# Patient Record
Sex: Female | Born: 1945 | Race: White | Hispanic: No | Marital: Married
Health system: Southern US, Community
[De-identification: ages and names within clinical notes are randomized; demographics above are authoritative.]

---

## 1999-12-01 ENCOUNTER — Encounter: Admission: RE | Admit: 1999-12-01 | Discharge: 1999-12-01 | Payer: Self-pay | Admitting: Nephrology

## 1999-12-01 ENCOUNTER — Encounter: Payer: Self-pay | Admitting: Nephrology

## 2003-07-15 ENCOUNTER — Encounter: Admission: RE | Admit: 2003-07-15 | Discharge: 2003-07-15 | Payer: Self-pay | Admitting: Nephrology

## 2003-07-15 ENCOUNTER — Encounter: Payer: Self-pay | Admitting: Nephrology

## 2008-04-12 ENCOUNTER — Encounter: Admission: RE | Admit: 2008-04-12 | Discharge: 2008-04-12 | Payer: Self-pay | Admitting: Nephrology

## 2008-04-12 IMAGING — CR DG HIP COMPLETE 2+V*R*
2 series · 2 of 2 positions shown · non-contrast
Comparison: None

CLINICAL DATA: Bilateral hip pain for 2 months, no trauma

RIGHT HIP - COMPLETE 2+ VIEW

[t hip ap right]
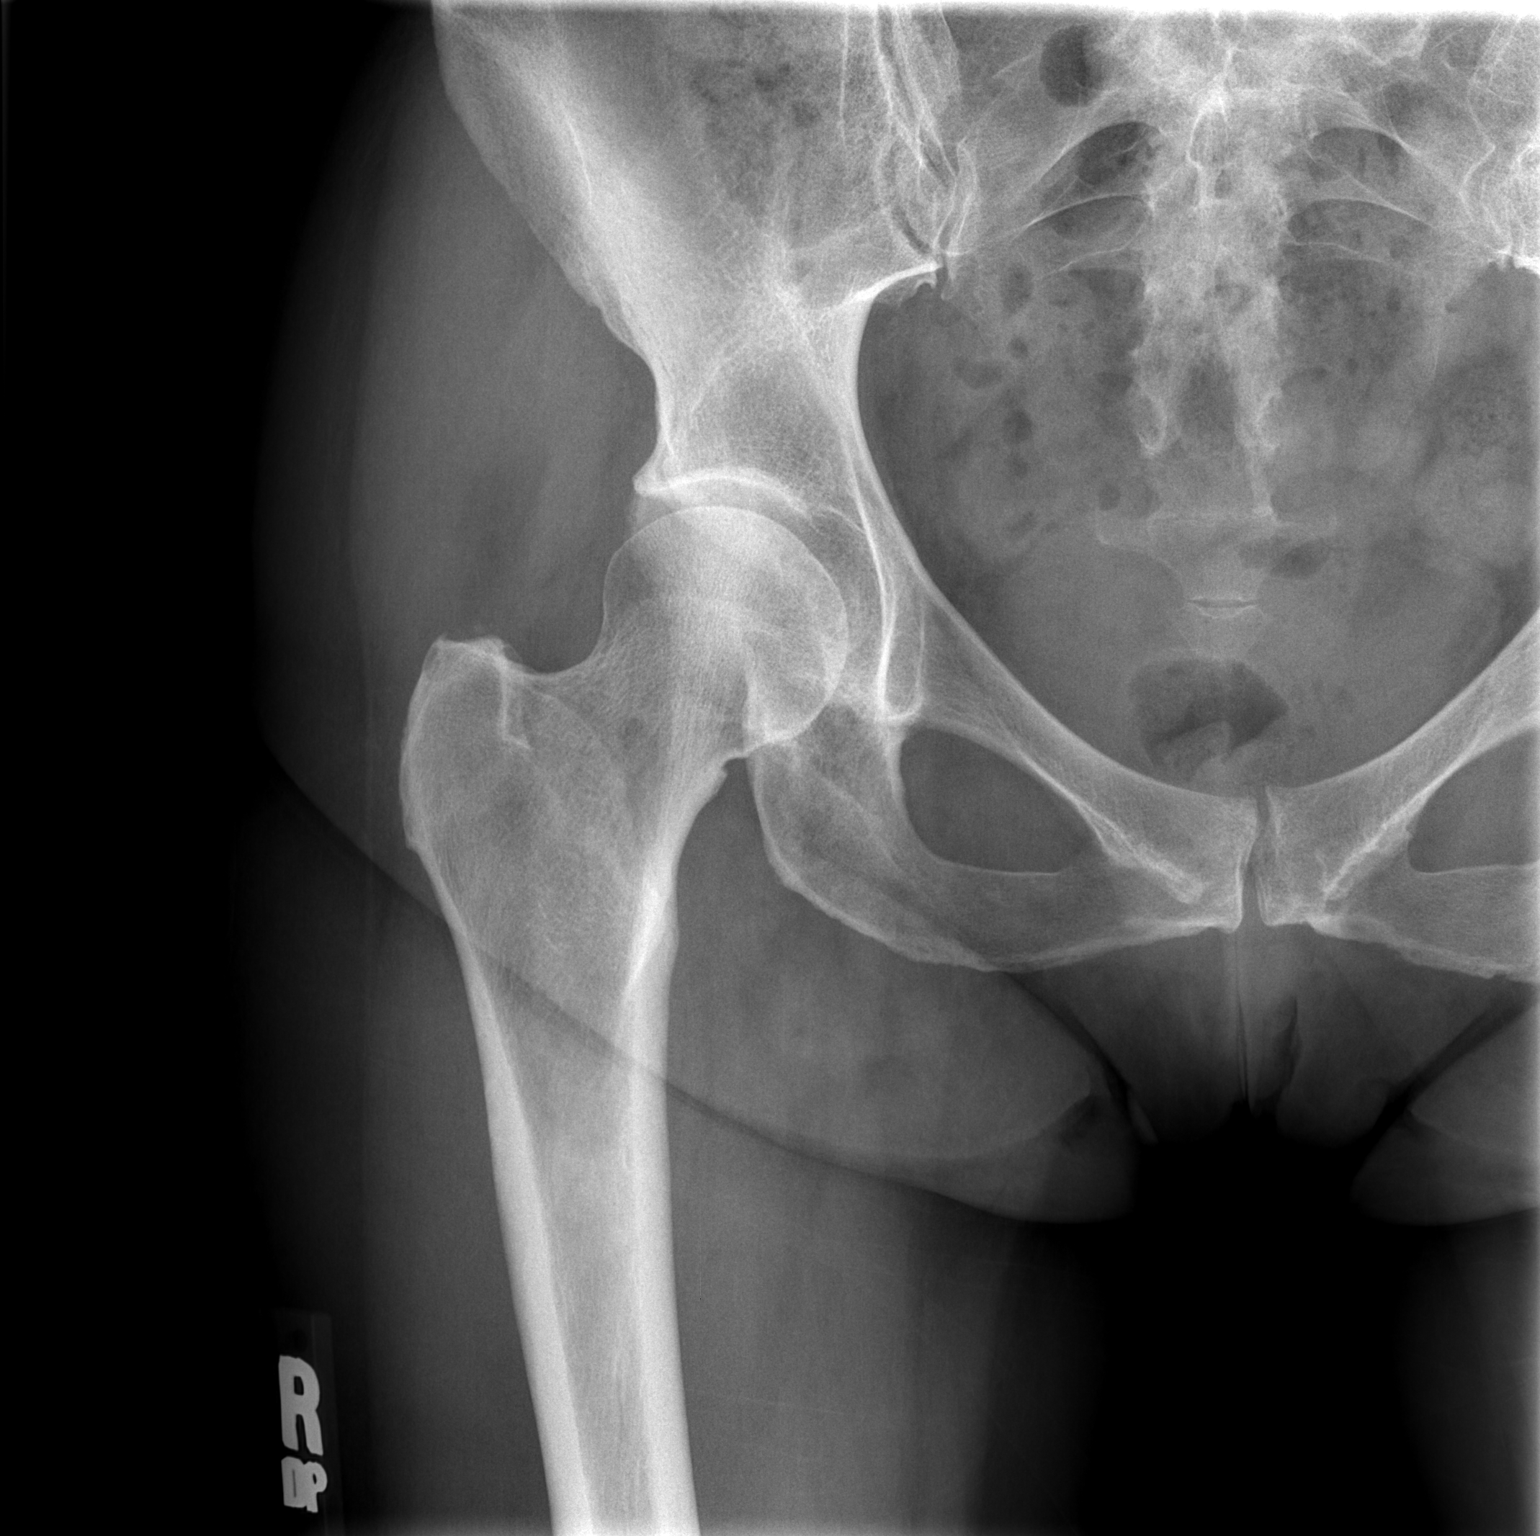

[t hip frog leg right]
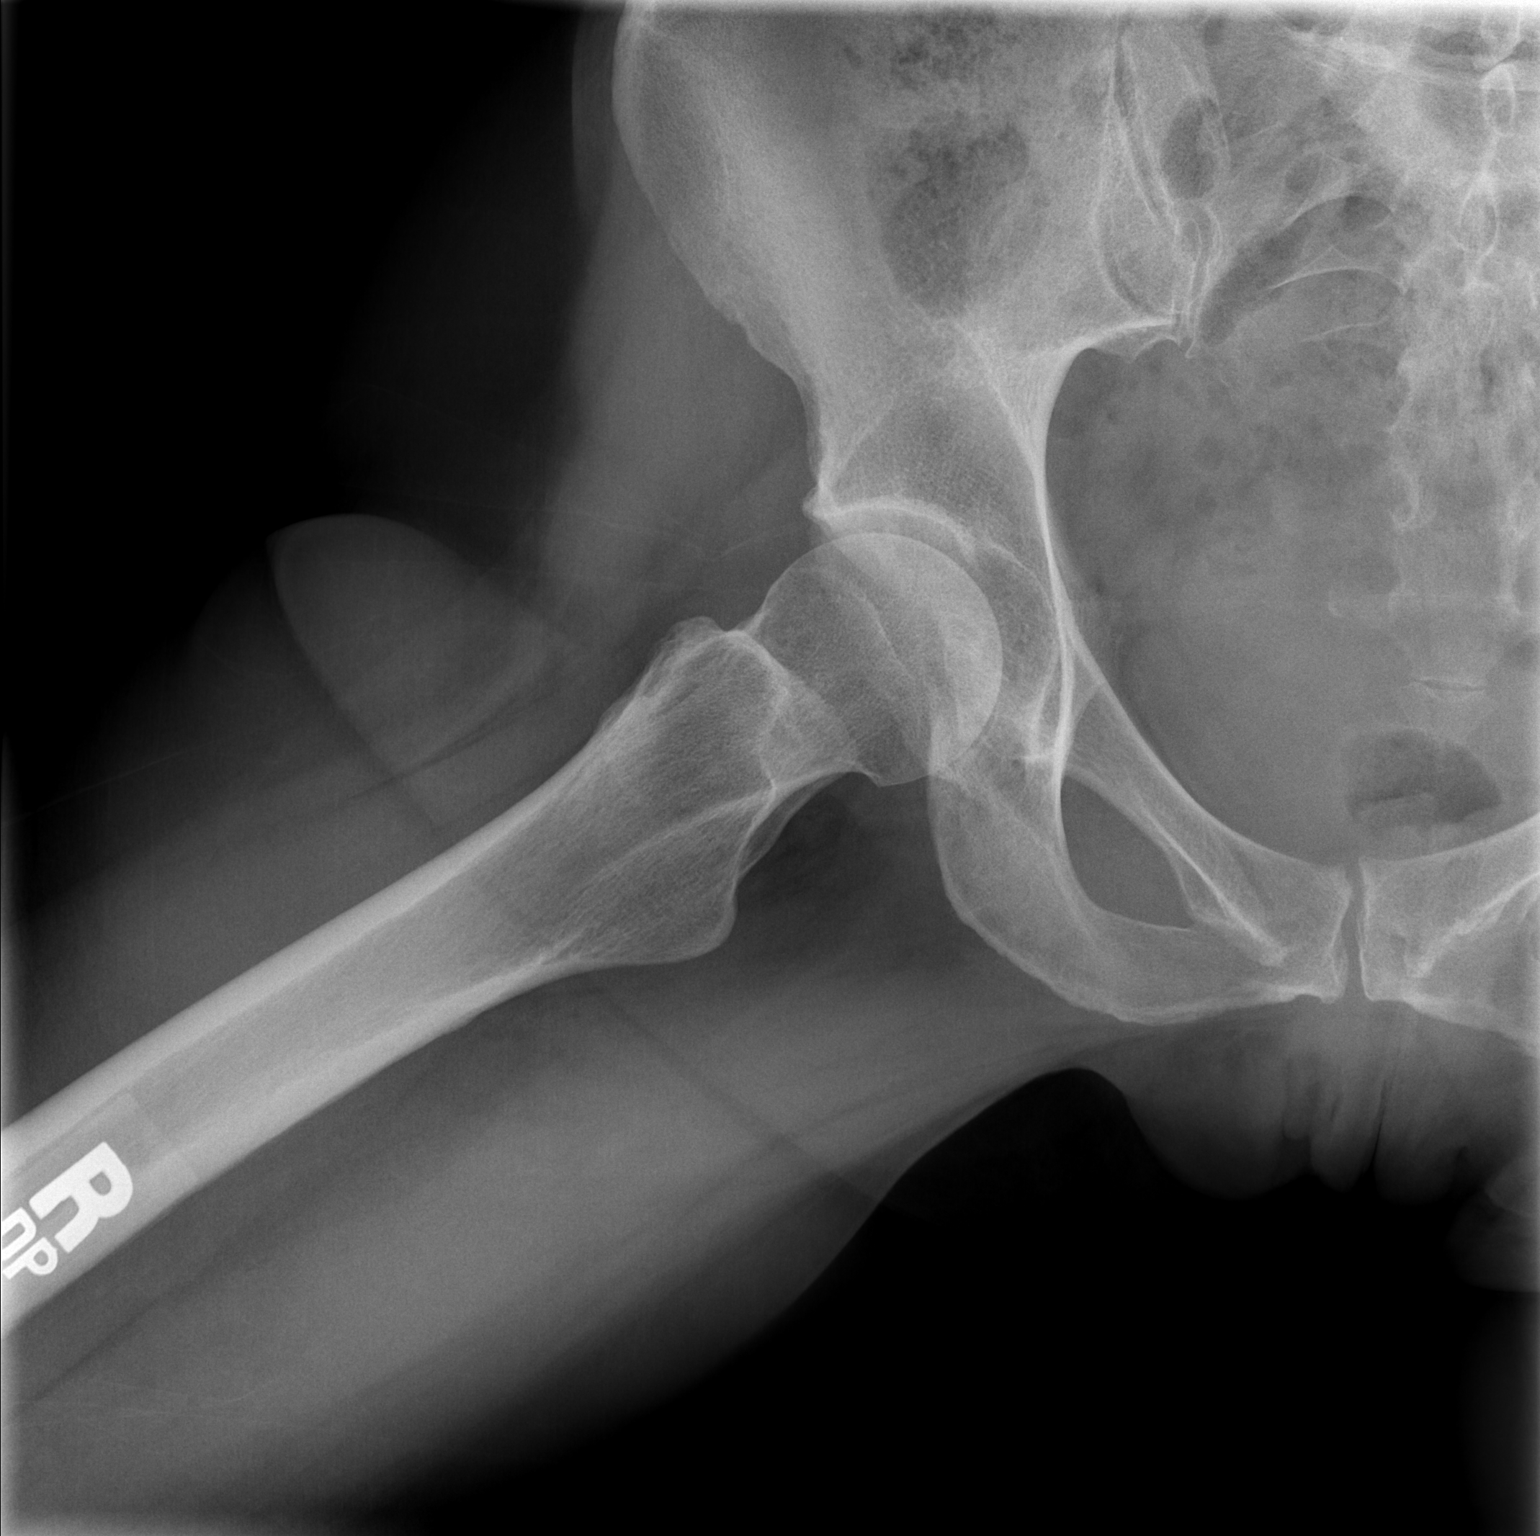

[2 of 2 positions shown; findings below may reference images not displayed]

FINDINGS: The right hip joint space is normal.  No significant
degenerative change is seen and no acute abnormality is noted.  The
right ramus is intact
IMPRESSION: .  Negative right hip.

## 2008-07-07 ENCOUNTER — Encounter: Admission: RE | Admit: 2008-07-07 | Discharge: 2008-07-07 | Payer: Self-pay

## 2009-04-30 ENCOUNTER — Encounter: Admission: RE | Admit: 2009-04-30 | Discharge: 2009-07-02 | Payer: Self-pay

## 2009-06-20 ENCOUNTER — Encounter: Admission: RE | Admit: 2009-06-20 | Discharge: 2009-06-20 | Payer: Self-pay | Admitting: Rheumatology

## 2009-07-02 ENCOUNTER — Encounter: Admission: RE | Admit: 2009-07-02 | Discharge: 2009-07-02 | Payer: Self-pay | Admitting: Sports Medicine

## 2009-08-14 ENCOUNTER — Ambulatory Visit (HOSPITAL_COMMUNITY): Admission: RE | Admit: 2009-08-14 | Discharge: 2009-08-15 | Payer: Self-pay | Admitting: Neurosurgery

## 2011-01-06 LAB — BASIC METABOLIC PANEL
BUN: 6 mg/dL (ref 6–23)
CO2: 29 mEq/L (ref 19–32)
Calcium: 9.1 mg/dL (ref 8.4–10.5)
Chloride: 100 mEq/L (ref 96–112)
Creatinine, Ser: 0.65 mg/dL (ref 0.4–1.2)
GFR calc Af Amer: >60 mL/min (ref >60)
GFR calc non Af Amer: >60 mL/min (ref >60)
Glucose, Bld: 103 mg/dL — ABNORMAL HIGH (ref 70–99)
Potassium: 4.8 mEq/L (ref 3.5–5.1)
Sodium: 136 mEq/L (ref 135–145)

## 2011-01-06 LAB — CBC
HCT: 40.5 % (ref 36.0–46.0)
Hemoglobin: 13.8 g/dL (ref 12.0–15.0)
MCHC: 34.1 g/dL (ref 30.0–36.0)
MCV: 96.1 fL (ref 78.0–100.0)
Platelets: 362 10*3/uL (ref 150–400)
RBC: 4.21 MIL/uL (ref 3.87–5.11)
RDW: 14.9 % (ref 11.5–15.5)
WBC: 10.7 10*3/uL — ABNORMAL HIGH (ref 4.0–10.5)

## 2011-01-22 ENCOUNTER — Ambulatory Visit (HOSPITAL_COMMUNITY): Payer: 59 | Attending: Internal Medicine

## 2011-01-22 DIAGNOSIS — M810 Age-related osteoporosis without current pathological fracture: Secondary | ICD-10-CM | POA: Insufficient documentation

## 2019-11-30 ENCOUNTER — Ambulatory Visit: Payer: Medicare Other | Attending: Internal Medicine

## 2019-11-30 DIAGNOSIS — Z23 Encounter for immunization: Secondary | ICD-10-CM | POA: Insufficient documentation

## 2019-11-30 NOTE — Progress Notes (Signed)
   Covid-[facility] Vaccination Clinic  Name:  Cindy Sloan    MRN: [id number] DOB: 1946-04-07  11/30/2019  Cindy Sloan was observed post Covid-19 immunization for 15 minutes without incidence. She was provided with Vaccine Information Sheet and instruction to access the V-Safe system.   Cindy Sloan was instructed to call 911 with any severe reactions post vaccine: Marland Kitchen Difficulty breathing  . Swelling of your face and throat  . A fast heartbeat  . A bad rash all over your body  . Dizziness and weakness    Immunizations Administered    Name Date Dose VIS Date Route   Pfizer COVID-19 Vaccine 11/30/2019 10:21 AM 0.3 mL 09/14/2019 Intramuscular   Manufacturer: [employer], [employer]   Lot: [id number]   NDC: [address]-9

## 2019-12-25 ENCOUNTER — Ambulatory Visit: Payer: Medicare Other | Attending: Internal Medicine

## 2019-12-25 DIAGNOSIS — Z23 Encounter for immunization: Secondary | ICD-10-CM

## 2019-12-25 NOTE — Progress Notes (Signed)
   Covid-19 Vaccination Clinic  Name:  Cindy Sloan    MRN: [id number] DOB: 03-30-1946  12/25/2019  Cindy Sloan was observed post Covid-19 immunization for 15 minutes without incident. She was provided with Vaccine Information Sheet and instruction to access the V-Safe system.   Cindy Sloan was instructed to call 911 with any severe reactions post vaccine: Marland Kitchen Difficulty breathing  . Swelling of face and throat  . A fast heartbeat  . A bad rash all over body  . Dizziness and weakness   Immunizations Administered    Name Date Dose VIS Date Route   Pfizer COVID-19 Vaccine 12/25/2019 12:24 PM 0.3 mL 09/14/2019 Intramuscular   Manufacturer: [employer], [employer]   Lot: [id number]   NDC: [address]-1
